# Patient Record
Sex: Male | Born: 1955 | Race: White | Hispanic: No | Marital: Married | Smoking: Never smoker
Health system: Southern US, Community
[De-identification: ages and names within clinical notes are randomized; demographics above are authoritative.]

## PROBLEM LIST (undated history)

## (undated) DIAGNOSIS — I10 Essential (primary) hypertension: Secondary | ICD-10-CM

---

## 1987-04-06 HISTORY — PX: APPENDECTOMY: SHX54

## 1987-04-06 HISTORY — PX: KNEE DEBRIDEMENT: SHX1894

## 2001-08-19 ENCOUNTER — Encounter: Payer: Self-pay | Admitting: Emergency Medicine

## 2001-08-19 ENCOUNTER — Emergency Department: Admission: EM | Admit: 2001-08-19 | Discharge: 2001-08-19 | Payer: Self-pay | Admitting: Emergency Medicine

## 2001-09-11 ENCOUNTER — Ambulatory Visit (HOSPITAL_COMMUNITY): Admission: RE | Admit: 2001-09-11 | Discharge: 2001-09-11 | Payer: Self-pay | Admitting: *Deleted

## 2001-09-11 ENCOUNTER — Encounter: Payer: Self-pay | Admitting: *Deleted

## 2002-06-22 ENCOUNTER — Inpatient Hospital Stay (HOSPITAL_COMMUNITY): Admission: EM | Admit: 2002-06-22 | Discharge: 2002-06-23 | Payer: Self-pay | Admitting: Emergency Medicine

## 2002-06-22 ENCOUNTER — Encounter: Payer: Self-pay | Admitting: Emergency Medicine

## 2002-06-23 ENCOUNTER — Encounter: Payer: Self-pay | Admitting: Internal Medicine

## 2009-10-08 ENCOUNTER — Emergency Department (HOSPITAL_COMMUNITY): Admission: EM | Admit: 2009-10-08 | Discharge: 2009-10-08 | Payer: Self-pay | Admitting: Emergency Medicine

## 2010-01-31 ENCOUNTER — Emergency Department (HOSPITAL_COMMUNITY): Admission: EM | Admit: 2010-01-31 | Discharge: 2010-01-31 | Payer: Self-pay | Admitting: Emergency Medicine

## 2010-06-21 LAB — POCT CARDIAC MARKERS
CKMB, poc: <1 ng/mL — ABNORMAL LOW (ref 1.0–8.0)
Myoglobin, poc: 91.3 ng/mL (ref 12–200)
Troponin i, poc: <0.05 ng/mL (ref 0.00–0.09)

## 2010-06-21 LAB — DIFFERENTIAL
Basophils Absolute: 0 10*3/uL (ref 0.0–0.1)
Eosinophils Absolute: 0.1 10*3/uL (ref 0.0–0.7)
Eosinophils Relative: 1 % (ref 0–5)
Lymphocytes Relative: 18 % (ref 12–46)
Monocytes Absolute: 0.7 10*3/uL (ref 0.1–1.0)

## 2010-06-21 LAB — CBC: MCH: 30.4 pg (ref 26.0–34.0)

## 2010-06-21 LAB — POCT I-STAT, CHEM 8
BUN: 16 mg/dL (ref 6–23)
Calcium, Ion: 1.07 mmol/L — ABNORMAL LOW (ref 1.12–1.32)
Chloride: 106 mEq/L (ref 96–112)
Glucose, Bld: 91 mg/dL (ref 70–99)
Hemoglobin: 15.6 g/dL (ref 13.0–17.0)

## 2011-04-07 ENCOUNTER — Other Ambulatory Visit: Payer: Self-pay | Admitting: Family Medicine

## 2011-08-16 ENCOUNTER — Other Ambulatory Visit: Payer: Self-pay | Admitting: Family Medicine

## 2011-08-16 DIAGNOSIS — M25561 Pain in right knee: Secondary | ICD-10-CM

## 2011-08-24 ENCOUNTER — Ambulatory Visit
Admission: RE | Admit: 2011-08-24 | Discharge: 2011-08-24 | Disposition: A | Discharge status: Home / Self Care | Payer: Managed Care, Other (non HMO) | Source: Ambulatory Visit | Attending: Family Medicine | Admitting: Family Medicine

## 2011-08-24 DIAGNOSIS — M25561 Pain in right knee: Secondary | ICD-10-CM

## 2011-09-04 ENCOUNTER — Ambulatory Visit
Admission: RE | Admit: 2011-09-04 | Discharge: 2011-09-04 | Disposition: A | Discharge status: Home / Self Care | Payer: Managed Care, Other (non HMO) | Source: Ambulatory Visit | Attending: Family Medicine | Admitting: Family Medicine

## 2015-07-30 DIAGNOSIS — S30860A Insect bite (nonvenomous) of lower back and pelvis, initial encounter: Secondary | ICD-10-CM | POA: Diagnosis not present

## 2016-01-19 DIAGNOSIS — F419 Anxiety disorder, unspecified: Secondary | ICD-10-CM | POA: Diagnosis not present

## 2016-01-19 DIAGNOSIS — Z0000 Encounter for general adult medical examination without abnormal findings: Secondary | ICD-10-CM | POA: Diagnosis not present

## 2016-01-19 DIAGNOSIS — Z125 Encounter for screening for malignant neoplasm of prostate: Secondary | ICD-10-CM | POA: Diagnosis not present

## 2016-01-19 DIAGNOSIS — Z23 Encounter for immunization: Secondary | ICD-10-CM | POA: Diagnosis not present

## 2016-01-19 DIAGNOSIS — M1711 Unilateral primary osteoarthritis, right knee: Secondary | ICD-10-CM | POA: Diagnosis not present

## 2016-01-19 DIAGNOSIS — I10 Essential (primary) hypertension: Secondary | ICD-10-CM | POA: Diagnosis not present

## 2016-01-19 DIAGNOSIS — G4700 Insomnia, unspecified: Secondary | ICD-10-CM | POA: Diagnosis not present

## 2016-03-03 DIAGNOSIS — K136 Irritative hyperplasia of oral mucosa: Secondary | ICD-10-CM | POA: Diagnosis not present

## 2016-08-04 DIAGNOSIS — M1711 Unilateral primary osteoarthritis, right knee: Secondary | ICD-10-CM | POA: Diagnosis not present

## 2016-08-04 DIAGNOSIS — M1712 Unilateral primary osteoarthritis, left knee: Secondary | ICD-10-CM | POA: Diagnosis not present

## 2016-10-19 DIAGNOSIS — I10 Essential (primary) hypertension: Secondary | ICD-10-CM | POA: Diagnosis not present

## 2016-10-19 DIAGNOSIS — G4700 Insomnia, unspecified: Secondary | ICD-10-CM | POA: Diagnosis not present

## 2016-10-19 DIAGNOSIS — R05 Cough: Secondary | ICD-10-CM | POA: Diagnosis not present

## 2016-10-19 DIAGNOSIS — H00015 Hordeolum externum left lower eyelid: Secondary | ICD-10-CM | POA: Diagnosis not present

## 2016-11-22 DIAGNOSIS — E669 Obesity, unspecified: Secondary | ICD-10-CM | POA: Diagnosis not present

## 2016-11-22 DIAGNOSIS — G4700 Insomnia, unspecified: Secondary | ICD-10-CM | POA: Diagnosis not present

## 2016-11-22 DIAGNOSIS — F419 Anxiety disorder, unspecified: Secondary | ICD-10-CM | POA: Diagnosis not present

## 2016-12-02 DIAGNOSIS — F419 Anxiety disorder, unspecified: Secondary | ICD-10-CM | POA: Diagnosis not present

## 2016-12-02 DIAGNOSIS — Z23 Encounter for immunization: Secondary | ICD-10-CM | POA: Diagnosis not present

## 2016-12-02 DIAGNOSIS — G4700 Insomnia, unspecified: Secondary | ICD-10-CM | POA: Diagnosis not present

## 2016-12-02 DIAGNOSIS — I10 Essential (primary) hypertension: Secondary | ICD-10-CM | POA: Diagnosis not present

## 2016-12-02 DIAGNOSIS — R0683 Snoring: Secondary | ICD-10-CM | POA: Diagnosis not present

## 2016-12-29 DIAGNOSIS — F5104 Psychophysiologic insomnia: Secondary | ICD-10-CM | POA: Diagnosis not present

## 2016-12-29 DIAGNOSIS — G478 Other sleep disorders: Secondary | ICD-10-CM | POA: Diagnosis not present

## 2016-12-29 DIAGNOSIS — R0683 Snoring: Secondary | ICD-10-CM | POA: Diagnosis not present

## 2016-12-29 DIAGNOSIS — F419 Anxiety disorder, unspecified: Secondary | ICD-10-CM | POA: Diagnosis not present

## 2017-01-05 DIAGNOSIS — F4322 Adjustment disorder with anxiety: Secondary | ICD-10-CM | POA: Diagnosis not present

## 2017-01-19 DIAGNOSIS — F4322 Adjustment disorder with anxiety: Secondary | ICD-10-CM | POA: Diagnosis not present

## 2017-04-18 DIAGNOSIS — I10 Essential (primary) hypertension: Secondary | ICD-10-CM | POA: Diagnosis not present

## 2017-04-18 DIAGNOSIS — F419 Anxiety disorder, unspecified: Secondary | ICD-10-CM | POA: Diagnosis not present

## 2017-04-18 DIAGNOSIS — M1711 Unilateral primary osteoarthritis, right knee: Secondary | ICD-10-CM | POA: Diagnosis not present

## 2017-04-18 DIAGNOSIS — Z125 Encounter for screening for malignant neoplasm of prostate: Secondary | ICD-10-CM | POA: Diagnosis not present

## 2017-04-18 DIAGNOSIS — Z0000 Encounter for general adult medical examination without abnormal findings: Secondary | ICD-10-CM | POA: Diagnosis not present

## 2017-04-18 DIAGNOSIS — G4700 Insomnia, unspecified: Secondary | ICD-10-CM | POA: Diagnosis not present

## 2017-06-03 DIAGNOSIS — M170 Bilateral primary osteoarthritis of knee: Secondary | ICD-10-CM | POA: Diagnosis not present

## 2017-06-03 DIAGNOSIS — M25562 Pain in left knee: Secondary | ICD-10-CM | POA: Diagnosis not present

## 2017-06-03 DIAGNOSIS — M25561 Pain in right knee: Secondary | ICD-10-CM | POA: Diagnosis not present

## 2017-06-29 DIAGNOSIS — M1711 Unilateral primary osteoarthritis, right knee: Secondary | ICD-10-CM | POA: Diagnosis not present

## 2017-06-29 DIAGNOSIS — M1712 Unilateral primary osteoarthritis, left knee: Secondary | ICD-10-CM | POA: Diagnosis not present

## 2017-07-27 DIAGNOSIS — M1712 Unilateral primary osteoarthritis, left knee: Secondary | ICD-10-CM | POA: Diagnosis not present

## 2017-07-27 DIAGNOSIS — M1711 Unilateral primary osteoarthritis, right knee: Secondary | ICD-10-CM | POA: Diagnosis not present

## 2018-04-20 DIAGNOSIS — M1711 Unilateral primary osteoarthritis, right knee: Secondary | ICD-10-CM | POA: Diagnosis not present

## 2018-04-20 DIAGNOSIS — G4700 Insomnia, unspecified: Secondary | ICD-10-CM | POA: Diagnosis not present

## 2018-04-20 DIAGNOSIS — I10 Essential (primary) hypertension: Secondary | ICD-10-CM | POA: Diagnosis not present

## 2018-04-20 DIAGNOSIS — F419 Anxiety disorder, unspecified: Secondary | ICD-10-CM | POA: Diagnosis not present

## 2018-10-09 DIAGNOSIS — M170 Bilateral primary osteoarthritis of knee: Secondary | ICD-10-CM | POA: Diagnosis not present

## 2018-11-13 DIAGNOSIS — M170 Bilateral primary osteoarthritis of knee: Secondary | ICD-10-CM | POA: Diagnosis not present

## 2018-11-15 DIAGNOSIS — Z86010 Personal history of colonic polyps: Secondary | ICD-10-CM | POA: Diagnosis not present

## 2018-11-15 DIAGNOSIS — K635 Polyp of colon: Secondary | ICD-10-CM | POA: Diagnosis not present

## 2018-11-15 DIAGNOSIS — K648 Other hemorrhoids: Secondary | ICD-10-CM | POA: Diagnosis not present

## 2018-11-21 DIAGNOSIS — M25552 Pain in left hip: Secondary | ICD-10-CM | POA: Diagnosis not present

## 2018-11-21 DIAGNOSIS — M7062 Trochanteric bursitis, left hip: Secondary | ICD-10-CM | POA: Diagnosis not present

## 2018-11-27 DIAGNOSIS — R7309 Other abnormal glucose: Secondary | ICD-10-CM | POA: Diagnosis not present

## 2018-11-27 DIAGNOSIS — Z23 Encounter for immunization: Secondary | ICD-10-CM | POA: Diagnosis not present

## 2018-11-27 DIAGNOSIS — K648 Other hemorrhoids: Secondary | ICD-10-CM | POA: Diagnosis not present

## 2018-11-27 DIAGNOSIS — Z125 Encounter for screening for malignant neoplasm of prostate: Secondary | ICD-10-CM | POA: Diagnosis not present

## 2018-11-27 DIAGNOSIS — I10 Essential (primary) hypertension: Secondary | ICD-10-CM | POA: Diagnosis not present

## 2018-11-27 DIAGNOSIS — L821 Other seborrheic keratosis: Secondary | ICD-10-CM | POA: Diagnosis not present

## 2018-11-27 DIAGNOSIS — Z0000 Encounter for general adult medical examination without abnormal findings: Secondary | ICD-10-CM | POA: Diagnosis not present

## 2019-01-05 DIAGNOSIS — D1801 Hemangioma of skin and subcutaneous tissue: Secondary | ICD-10-CM | POA: Diagnosis not present

## 2019-01-05 DIAGNOSIS — D235 Other benign neoplasm of skin of trunk: Secondary | ICD-10-CM | POA: Diagnosis not present

## 2019-01-05 DIAGNOSIS — D225 Melanocytic nevi of trunk: Secondary | ICD-10-CM | POA: Diagnosis not present

## 2019-01-05 DIAGNOSIS — L821 Other seborrheic keratosis: Secondary | ICD-10-CM | POA: Diagnosis not present

## 2019-01-05 DIAGNOSIS — D485 Neoplasm of uncertain behavior of skin: Secondary | ICD-10-CM | POA: Diagnosis not present

## 2019-01-05 DIAGNOSIS — L718 Other rosacea: Secondary | ICD-10-CM | POA: Diagnosis not present

## 2019-03-08 DIAGNOSIS — I10 Essential (primary) hypertension: Secondary | ICD-10-CM | POA: Diagnosis not present

## 2019-03-08 DIAGNOSIS — F419 Anxiety disorder, unspecified: Secondary | ICD-10-CM | POA: Diagnosis not present

## 2019-03-08 DIAGNOSIS — R7309 Other abnormal glucose: Secondary | ICD-10-CM | POA: Diagnosis not present

## 2019-03-08 DIAGNOSIS — G4700 Insomnia, unspecified: Secondary | ICD-10-CM | POA: Diagnosis not present

## 2019-04-25 DIAGNOSIS — M170 Bilateral primary osteoarthritis of knee: Secondary | ICD-10-CM | POA: Diagnosis not present

## 2019-07-07 ENCOUNTER — Ambulatory Visit: Payer: Managed Care, Other (non HMO) | Attending: Internal Medicine

## 2019-07-07 DIAGNOSIS — Z23 Encounter for immunization: Secondary | ICD-10-CM

## 2019-07-07 NOTE — Progress Notes (Signed)
   Covid-19 Vaccination Clinic  Name:  Walter Strickland    MRN: [id number] DOB: November 02, 1955  07/07/2019  [name] was observed post Covid-19 immunization for 30 minutes based on pre-vaccination screening without incident. He was provided with Vaccine Information Sheet and instruction to access the V-Safe system.   [name] was instructed to call 911 with any severe reactions post vaccine: Marland Kitchen Difficulty breathing  . Swelling of face and throat  . A fast heartbeat  . A bad rash all over body  . Dizziness and weakness   Immunizations Administered    Name Date Dose VIS Date Route   Pfizer COVID-19 Vaccine 07/07/2019  2:16 PM 0.3 mL 03/16/2019 Intramuscular   Manufacturer: [employer], [employer]   Lot: [id number]   NDC: [address]-1

## 2019-08-01 ENCOUNTER — Ambulatory Visit: Payer: Managed Care, Other (non HMO) | Attending: Internal Medicine

## 2019-08-01 DIAGNOSIS — Z23 Encounter for immunization: Secondary | ICD-10-CM

## 2019-08-01 NOTE — Progress Notes (Signed)
   Covid-19 Vaccination Clinic  Name:  Walter Strickland    MRN: [id number] DOB: 23-Mar-1956  08/01/2019  [name] was observed post Covid-19 immunization for 15 minutes without incident. He was provided with Vaccine Information Sheet and instruction to access the V-Safe system.   [name] was instructed to call 911 with any severe reactions post vaccine: Marland Kitchen Difficulty breathing  . Swelling of face and throat  . A fast heartbeat  . A bad rash all over body  . Dizziness and weakness   Immunizations Administered    Name Date Dose VIS Date Route   Pfizer COVID-19 Vaccine 08/01/2019  8:57 AM 0.3 mL 05/30/2018 Intramuscular   Manufacturer: [employer], [employer]   Lot: [id number]   NDC: [address]-8

## 2019-09-27 ENCOUNTER — Other Ambulatory Visit: Payer: Self-pay

## 2019-09-27 ENCOUNTER — Emergency Department (HOSPITAL_COMMUNITY)
Admission: EM | Admit: 2019-09-27 | Discharge: 2019-09-27 | Disposition: A | Discharge status: Home / Self Care | Payer: Medicare Other | Attending: Emergency Medicine | Admitting: Emergency Medicine

## 2019-09-27 ENCOUNTER — Emergency Department (HOSPITAL_COMMUNITY): Payer: Medicare Other

## 2019-09-27 DIAGNOSIS — Z79899 Other long term (current) drug therapy: Secondary | ICD-10-CM | POA: Diagnosis not present

## 2019-09-27 DIAGNOSIS — Z880 Allergy status to penicillin: Secondary | ICD-10-CM | POA: Diagnosis not present

## 2019-09-27 DIAGNOSIS — N281 Cyst of kidney, acquired: Secondary | ICD-10-CM | POA: Insufficient documentation

## 2019-09-27 DIAGNOSIS — R1031 Right lower quadrant pain: Secondary | ICD-10-CM | POA: Diagnosis present

## 2019-09-27 DIAGNOSIS — I10 Essential (primary) hypertension: Secondary | ICD-10-CM | POA: Diagnosis not present

## 2019-09-27 LAB — CBC WITH DIFFERENTIAL/PLATELET
Abs Immature Granulocytes: 0.04 10*3/uL (ref 0.00–0.07)
Basophils Absolute: 0 10*3/uL (ref 0.0–0.1)
Basophils Relative: 0 %
Eosinophils Absolute: 0 10*3/uL (ref 0.0–0.5)
Eosinophils Relative: 0 %
HCT: 45.7 % (ref 39.0–52.0)
Hemoglobin: 14.8 g/dL (ref 13.0–17.0)
Immature Granulocytes: 0 %
Lymphocytes Relative: 7 %
Lymphs Abs: 0.7 10*3/uL (ref 0.7–4.0)
MCH: 29.9 pg (ref 26.0–34.0)
MCHC: 32.4 g/dL (ref 30.0–36.0)
MCV: 92.3 fL (ref 80.0–100.0)
Monocytes Absolute: 0.4 10*3/uL (ref 0.1–1.0)
Monocytes Relative: 4 %
Neutro Abs: 9 10*3/uL — ABNORMAL HIGH (ref 1.7–7.7)
Neutrophils Relative %: 89 %
Platelets: 251 10*3/uL (ref 150–400)
RBC: 4.95 MIL/uL (ref 4.22–5.81)
RDW: 13.6 % (ref 11.5–15.5)
WBC: 10.2 10*3/uL (ref 4.0–10.5)
nRBC: 0 % (ref 0.0–0.2)

## 2019-09-27 LAB — COMPREHENSIVE METABOLIC PANEL
ALT: 21 U/L (ref 0–44)
AST: 17 U/L (ref 15–41)
Albumin: 4.2 g/dL (ref 3.5–5.0)
Alkaline Phosphatase: 69 U/L (ref 38–126)
Anion gap: 8 (ref 5–15)
BUN: 17 mg/dL (ref 8–23)
CO2: 24 mmol/L (ref 22–32)
Calcium: 8.9 mg/dL (ref 8.9–10.3)
Chloride: 107 mmol/L (ref 98–111)
Creatinine, Ser: 1.06 mg/dL (ref 0.61–1.24)
GFR calc Af Amer: >60 mL/min (ref >60)
GFR calc non Af Amer: >60 mL/min (ref >60)
Glucose, Bld: 141 mg/dL — ABNORMAL HIGH (ref 70–99)
Potassium: 4.3 mmol/L (ref 3.5–5.1)
Sodium: 139 mmol/L (ref 135–145)
Total Bilirubin: 0.6 mg/dL (ref 0.3–1.2)
Total Protein: 7.5 g/dL (ref 6.5–8.1)

## 2019-09-27 LAB — URINALYSIS, ROUTINE W REFLEX MICROSCOPIC
Bilirubin Urine: NEGATIVE
Glucose, UA: NEGATIVE mg/dL
Hgb urine dipstick: NEGATIVE
Ketones, ur: NEGATIVE mg/dL
Leukocytes,Ua: NEGATIVE
Nitrite: NEGATIVE
Protein, ur: NEGATIVE mg/dL
Specific Gravity, Urine: 1.016 (ref 1.005–1.030)
pH: 8 (ref 5.0–8.0)

## 2019-09-27 LAB — LIPASE, BLOOD: Lipase: 26 U/L (ref 11–51)

## 2019-09-27 MED ORDER — ONDANSETRON HCL 4 MG/2ML IJ SOLN
4.0000 mg | Freq: Once | INTRAMUSCULAR | Status: AC
  Administered 2019-09-27: 4 mg via INTRAVENOUS
  Filled 2019-09-27 (×2): qty 2

## 2019-09-27 MED ORDER — SODIUM CHLORIDE (PF) 0.9 % IJ SOLN
INTRAMUSCULAR | Status: AC
  Filled 2019-09-27: qty 50

## 2019-09-27 MED ORDER — MORPHINE SULFATE (PF) 4 MG/ML IV SOLN
4.0000 mg | Freq: Once | INTRAVENOUS | Status: AC
  Administered 2019-09-27: 4 mg via INTRAVENOUS
  Filled 2019-09-27 (×2): qty 1

## 2019-09-27 MED ORDER — IOHEXOL 300 MG/ML  SOLN
100.0000 mL | Freq: Once | INTRAMUSCULAR | Status: AC | PRN
  Administered 2019-09-27: 100 mL via INTRAVENOUS

## 2019-09-27 MED ORDER — METHOCARBAMOL 500 MG PO TABS: 500.0000 mg | ORAL_TABLET | Freq: Two times a day (BID) | ORAL | 0 refills | Status: DC

## 2019-09-27 NOTE — ED Provider Notes (Signed)
[facility]-EMERGENCY DEPT Provider Note   CSN: [id number] Arrival date & time: 09/27/19  1055     History Chief Complaint  Patient presents with  . Abdominal Pain    Walter Strickland is a 64 y.o. male who presents for evaluation of abdominal pain.  He states that over the last 4 weeks, he has had intermittent right lower quadrant abdominal pain.  He states that it occurs randomly.  It is not brought on by eating and is not worsened by eating.  He has been able to eat and drink without any difficulty.  He has had some occasional nausea but no vomiting.  He reports that this morning, the pain got acutely and severely worse. Since then, it has been a constant sharp pain. He states it is in the right lower quadrant does not radiate.  He states it is worse with attempting to move and walk.  His last bowel movement was yesterday morning and was normal.  No presence of blood.  He states he has had an appendectomy.  No other abdominal surgeries.  He denies any fevers, chest pain, difficulty breathing, dysuria, hematuria, diarrhea.  The history is provided by the patient.       No past medical history on file.  There are no problems to display for this patient.    The histories are not reviewed yet. Please review them in the "History" navigator section and refresh this SmartLink.     No family history on file.  Social History   Tobacco Use  . Smoking status: Not on file  Substance Use Topics  . Alcohol use: Not on file  . Drug use: Not on file    Home Medications Prior to Admission medications   Medication Sig Start Date End Date Taking? Authorizing Provider  amLODipine (NORVASC) 5 MG tablet Take 5 mg by mouth daily. 07/16/19  Yes [provider]  Cholecalciferol 25 MCG (1000 UT) capsule Take 1,000 Units by mouth daily.   Yes [provider]  clonazePAM (KLONOPIN) 1 MG tablet Take 1 mg by mouth 2 (two) times daily as needed for anxiety.  04/03/19  Yes [provider]  losartan (COZAAR) 100 MG tablet Take 100 mg by mouth daily. 07/16/19  Yes [provider]  methocarbamol (ROBAXIN) 500 MG tablet Take 1 tablet (500 mg total) by mouth 2 (two) times daily. 09/27/19   Maxwell Caul, PA-C    Allergies    Penicillins  Review of Systems   Review of Systems  Constitutional: Negative for fever.  Respiratory: Negative for cough and shortness of breath.   Cardiovascular: Negative for chest pain.  Gastrointestinal: Positive for abdominal pain and nausea. Negative for vomiting.  Genitourinary: Negative for dysuria and hematuria.  Neurological: Negative for headaches.  All other systems reviewed and are negative.   Physical Exam Updated Vital Signs BP (!) 155/83 (BP Location: Left Arm)   Pulse 67   Temp 98.2 F (36.8 C) (Oral)   Resp 19   SpO2 97%   Physical Exam Vitals and nursing note reviewed.  Constitutional:      Appearance: Normal appearance. He is well-developed.  HENT:     Head: Normocephalic and atraumatic.  Eyes:     General: Lids are normal.     Conjunctiva/sclera: Conjunctivae normal.     Pupils: Pupils are equal, round, and reactive to light.  Cardiovascular:     Rate and Rhythm: Normal rate and regular rhythm.  Pulses: Normal pulses.          Radial pulses are 2+ on the right side.       Dorsalis pedis pulses are 2+ on the right side and 2+ on the left side.     Heart sounds: Normal heart sounds. No murmur heard.  No friction rub. No gallop.   Pulmonary:     Effort: Pulmonary effort is normal.     Breath sounds: Normal breath sounds.     Comments: Lungs clear to auscultation bilaterally.  Symmetric chest rise.  No wheezing, rales, rhonchi. Abdominal:     Palpations: Abdomen is soft. Abdomen is not rigid.     Tenderness: There is abdominal tenderness in the right lower quadrant. There is no right CVA tenderness, left CVA tenderness or guarding.     Hernia: A hernia is present.  Hernia is present in the umbilical area. There is no hernia in the left inguinal area, right femoral area, left femoral area or right inguinal area.     Comments: Abdomen soft, nondistended.  Tenderness palpation of right lower quadrant.  No CVA tenderness.  No rigidity, guarding.  Easily reducible umbilical hernia.  No other hernia noted.  Genitourinary:    Comments: The exam was performed with a chaperone present. Normal male genitalia. No evidence of rash, ulcers or lesions.  Musculoskeletal:        General: Normal range of motion.     Cervical back: Full passive range of motion without pain.  Skin:    General: Skin is warm and dry.     Capillary Refill: Capillary refill takes less than 2 seconds.  Neurological:     Mental Status: He is alert and oriented to person, place, and time.  Psychiatric:        Speech: Speech normal.     ED Results / Procedures / Treatments   Labs (all labs ordered are listed, but only abnormal results are displayed) Labs Reviewed  COMPREHENSIVE METABOLIC PANEL - Abnormal; Notable for the following components:      Result Value   Glucose, Bld 141 (*)    All other components within normal limits  CBC WITH DIFFERENTIAL/PLATELET - Abnormal; Notable for the following components:   Neutro Abs 9.0 (*)    All other components within normal limits  LIPASE, BLOOD  URINALYSIS, ROUTINE W REFLEX MICROSCOPIC    EKG None  Radiology CT Abdomen Pelvis W Contrast  Result Date: 09/27/2019 CLINICAL DATA:  Abdominal pain for 4 weeks worsening EXAM: CT ABDOMEN AND PELVIS WITH CONTRAST TECHNIQUE: Multidetector CT imaging of the abdomen and pelvis was performed using the standard protocol following bolus administration of intravenous contrast. CONTRAST:  OMNIPAQUE IOHEXOL 300 MG/ML  SOLN COMPARISON:  None. FINDINGS: Lower chest: The visualized heart size within normal limits. No pericardial fluid/thickening. No hiatal hernia. The visualized portions of the lungs are  clear. Hepatobiliary: The liver is normal in density without focal abnormality.The main portal vein is patent. No evidence of calcified gallstones, gallbladder wall thickening or biliary dilatation. Pancreas: Unremarkable. No pancreatic ductal dilatation or surrounding inflammatory changes. Spleen: Normal in size without focal abnormality. Adrenals/Urinary Tract: Both adrenal glands appear normal. Multiple low-density lesions are both kidneys the largest 1 in lower of left kidney measures 4.2 cm consistent with a renal cyst. No or system calculi are seen. There is bilateral perinephric stranding right greater than left which is nonspecific. Bladder is partially decompressed. Stomach/Bowel: The stomach, small bowel, and colon are normal in appearance.  No inflammatory changes, wall thickening, or obstructive findings. Diverticula are noted without diverticulitis Vascular/Lymphatic: There are no enlarged mesenteric, retroperitoneal, or pelvic lymph nodes. Scattered aortic atherosclerotic calcifications are seen without aneurysmal dilatation. Reproductive: The prostate is unremarkable. Other: A small fat and bowel containing anterior umbilical hernia. No evidence of strangulation. Musculoskeletal: No acute or significant osseous findings. IMPRESSION: Mild bilateral perinephric stranding, right greater than left which is nonspecific, however could be due to infectious or inflammatory process. No renal or collecting system calculi. Diverticulosis without diverticulitis. Aortic Atherosclerosis (ICD10-I70.0). Electronically Signed   By: Prudencio Pair M.D.   On: 09/27/2019 13:03    Procedures Procedures (including critical care time)  Medications Ordered in ED Medications  ondansetron (ZOFRAN) injection 4 mg (has no administration in time range)  morphine 4 MG/ML injection 4 mg (has no administration in time range)  iohexol (OMNIPAQUE) 300 MG/ML solution 100 mL (100 mLs Intravenous Contrast Given 09/27/19 1210)    sodium chloride (PF) 0.9 % injection (  Given by Other 09/27/19 1315)    ED Course  I have reviewed the triage vital signs and the nursing notes.  Pertinent labs & imaging results that were available during my care of the patient were reviewed by me and considered in my medical decision making (see chart for details).    MDM Rules/Calculators/A&P                          64 year old male who presents for evaluation of abdominal pain.  He states it has been intermittently occurring for the last 4 weeks.  This morning, pain became more constant and severe.  No fevers, vomiting, diarrhea.  On initial ED arrival, he is afebrile, nontoxic-appearing.  Vital signs are stable.  On exam, he does have some tenderness noted on right lower quadrant.  Seems to be worse when he moves.  He does have an easily reducible umbilical hernia.  No other hernia noted.  Patient with previous appendectomy.  Low diverticulitis or infectious etiology.  Consider GU etiology versus musculoskeletal etiology given that it is worse with movement.  Plan check labs.  History/physical exam not concerning for dissection, ACS etiology.  Lipase is normal.  CBC shows no leukocytosis or anemia.  CMP shows normal BUN and creatinine.  UA negative for infectious etiology.  CT abdomen pelvis shows no evidence of acute abnormality.  He does have some stranding noted around the bilateral kidneys.  No evidence of kidney stone or or other acute abnormalities.  Discussed results with patient.  He refused pain medication.  He is comfortable when he sits.  When he gets up and walks and moves and bends, he feels that the pain is worse.  At this time, given reassuring work-up, reassuring CT scan, feel that this is most likely musculoskeletal in nature.  Wife is at his bedside now and states that he did mow the lawn right before he started having this pain a few weeks ago.  We will plan to treat with Robaxin. At this time, patient exhibits no  emergent life-threatening condition that require further evaluation in ED or admission. Discussed patient with Dr. Rex Kras who is agreeable to plan.  Patient had ample opportunity for questions and discussion. All patient's questions were answered with full understanding. Strict return precautions discussed. Patient expresses understanding and agreement to plan.   Portions of this note were generated with Lobbyist. Dictation errors may occur despite best attempts at proofreading.  Final Clinical Impression(s) / ED Diagnoses Final diagnoses:  Right lower quadrant abdominal pain  Renal cyst    Rx / DC Orders ED Discharge Orders         Ordered    methocarbamol (ROBAXIN) 500 MG tablet  2 times daily     Discontinue  Reprint     09/27/19 1429           Maxwell Caul, PA-C 09/27/19 1443    Little, Ambrose Finland, MD 09/28/19 1036

## 2019-09-27 NOTE — ED Notes (Signed)
Discharge paperwork and prescription reviewed with pt. Pt with no questions at this time. Family at bedside to drive pt home.

## 2019-09-27 NOTE — ED Notes (Signed)
Pt transported to CT ?

## 2019-09-27 NOTE — ED Notes (Signed)
Pt declined pain medication at this time, reports abdominal pain only present with movement. Will hold meds until pt requests them.

## 2019-09-27 NOTE — ED Triage Notes (Signed)
Pt BIBA from home.    Per EMS- Pt c/o abd pain x4 weeks, progressively worsening.  Today reports pain is unbearable.   100 mcg fentanyl (last given 1026).   PIV 20g r hand

## 2019-09-27 NOTE — Discharge Instructions (Signed)
As we discussed, your blood work and urine looked reassuring.  Your CT scan did show evidence of renal cysts on both kidneys.  These are most likely benign.  There is nothing to do about these today and is most likely not contributing to your pain.  These just need to be followed by her primary care doctor.  You can take Tylenol or Ibuprofen as directed for pain. You can alternate Tylenol and Ibuprofen every 4 hours. If you take Tylenol at 1pm, then you can take Ibuprofen at 5pm. Then you can take Tylenol again at 9pm.   Take Robaxin as prescribed. This medication will make you drowsy so do not drive or drink alcohol when taking it.  Follow-up with your primary care doctor in 2-4 days. Call their office and let them know you were seen in the ED.   Return to the Emergency Department immediately if you experience any worsening abdominal pain, fever, persistent nausea and vomiting, inability keep any food down, pain with urination, blood in your urine or any other worsening or concerning symptoms.

## 2020-07-16 ENCOUNTER — Other Ambulatory Visit: Payer: Self-pay

## 2020-07-16 ENCOUNTER — Encounter (INDEPENDENT_AMBULATORY_CARE_PROVIDER_SITE_OTHER): Payer: Self-pay | Admitting: Otolaryngology

## 2020-07-16 ENCOUNTER — Ambulatory Visit (INDEPENDENT_AMBULATORY_CARE_PROVIDER_SITE_OTHER): Payer: Medicare Other | Admitting: Otolaryngology

## 2020-07-16 VITALS — Temp 97.3°F

## 2020-07-16 DIAGNOSIS — J310 Chronic rhinitis: Secondary | ICD-10-CM | POA: Diagnosis not present

## 2020-07-16 DIAGNOSIS — J329 Chronic sinusitis, unspecified: Secondary | ICD-10-CM

## 2020-07-16 NOTE — Progress Notes (Signed)
HPI: Walter Strickland is a 65 y.o. male who presents is referred by his PCP for evaluation of complaints of chronic postnasal drip and has had for a couple of years.  He feels like this might of gotten worse since he started a new blood pressure medication amlodipine.  He does have history of allergies and has tried several antihistamines and he does not feel like it helped that much.  Although he did recently take Benadryl that seemed to help.  His drainage is worse at night and in the mornings.  It generally clears up during the day.  He has used Claritin in the past. He is allergic to penicillins..  No past medical history on file.  Social History   Socioeconomic History  . Marital status: Married    Spouse name: Not on file  . Number of children: Not on file  . Years of education: Not on file  . Highest education level: Not on file  Occupational History  . Not on file  Tobacco Use  . Smoking status: Never Smoker  . Smokeless tobacco: Never Used  Substance and Sexual Activity  . Alcohol use: Not on file  . Drug use: Not on file  . Sexual activity: Not on file  Other Topics Concern  . Not on file  Social History Narrative  . Not on file   Social Determinants of Health   Financial Resource Strain: Not on file  Food Insecurity: Not on file  Transportation Needs: Not on file  Physical Activity: Not on file  Stress: Not on file  Social Connections: Not on file   No family history on file. Allergies  Allergen Reactions  . Penicillins Rash   Prior to Admission medications   Medication Sig Start Date End Date Taking? Authorizing Provider  amLODipine (NORVASC) 5 MG tablet Take 5 mg by mouth daily. 07/16/19   [provider]  Cholecalciferol 25 MCG (1000 UT) capsule Take 1,000 Units by mouth daily.    [provider]  clonazePAM (KLONOPIN) 1 MG tablet Take 1 mg by mouth 2 (two) times daily as needed for anxiety. 04/03/19   [provider]  losartan  (COZAAR) 100 MG tablet Take 100 mg by mouth daily. 07/16/19   [provider]  methocarbamol (ROBAXIN) 500 MG tablet Take 1 tablet (500 mg total) by mouth 2 (two) times daily. 09/27/19   Maxwell Caul, PA-C     Positive ROS: Otherwise negative  All other systems have been reviewed and were otherwise negative with the exception of those mentioned in the HPI and as above.  Physical Exam: Constitutional: Alert, well-appearing, no acute distress Ears: External ears without lesions or tenderness. Ear canals are clear bilaterally with intact, clear TMs bilaterally. Nasal: External nose without lesions. Septum with minimal deformity and moderate rhinitis..  After decongesting the nose the middle meatus regions were clear bilaterally.  He had minimal clear mucus discharge in the nose and states that he is actually doing better today.  Posterior nasal cavity is clear. Oral: Lips and gums without lesions. Tongue and palate mucosa without lesions. Posterior oropharynx clear.  Symmetric appearing tonsils bilaterally.  Posterior oropharynx was clear with no significant postnasal drainage noted on clinical exam. Indirect laryngoscopy revealed a clear base of tongue vallecula and epiglottis.  Vocal cords were clear with normal mobility. Neck: No palpable adenopathy or masses Respiratory: Breathing comfortably  Skin: No facial/neck lesions or rash noted.  Procedures  Assessment: Chronic rhinitis with complaints of postnasal  drainage.  Plan: Reviewed with Walter Strickland concerning recommendations.  I would recommend regular use of nasal steroid spray such as Nasacort and prescribed Nasacort 2 sprays each nostril at night as this will help provide better airway passages and easier breathing as well as slightly less mucus production.  Also discussed with him concerning using saline irrigations as the salt water will help rinse the mucus out of the nose to help dry the nose up a little bit. Recommended  antihistamines plus minus as they do have some systemic effect but would also help dry things up if he is having bad allergy symptoms.  Apparently he recently saw his eye doctor who felt like he has some allergy symptoms in his eyes.  Concerning the antihistamines would recommend initially Claritin or Allegra as these are the least sedating antihistamines   Walter Bonds, MD   CC:

## 2021-03-05 ENCOUNTER — Encounter: Payer: Self-pay | Admitting: Allergy

## 2021-03-05 ENCOUNTER — Ambulatory Visit (INDEPENDENT_AMBULATORY_CARE_PROVIDER_SITE_OTHER): Payer: Medicare Other | Admitting: Allergy

## 2021-03-05 ENCOUNTER — Other Ambulatory Visit: Payer: Self-pay

## 2021-03-05 VITALS — BP 150/80 | HR 121 | Temp 98.1°F | Resp 18 | Ht 76.0 in | Wt 307.2 lb

## 2021-03-05 DIAGNOSIS — R0982 Postnasal drip: Secondary | ICD-10-CM

## 2021-03-05 DIAGNOSIS — J3089 Other allergic rhinitis: Secondary | ICD-10-CM | POA: Diagnosis not present

## 2021-03-05 MED ORDER — AZELASTINE HCL 0.1 % NA SOLN: 2.0000 | Freq: Two times a day (BID) | NASAL | 5 refills | Status: DC

## 2021-03-05 NOTE — Progress Notes (Signed)
New Patient Note  RE: Walter Strickland MRN: [id number] DOB: 1955/05/22 Date of Office Visit: 03/05/2021  Primary care provider: Antony Contras, MD  Chief Complaint: allergies  History of present illness: Walter Strickland is a 65 y.o. male presenting today for evaluation of post nasal drip.  He has been having issues with post nasal drip and is constantly sniffling and throat clearing.  Worse in the mornings and evenings.  He states this started couple years ago.  His PCP has prescribed antihistamines and nose sprays.  He has been to ENT.  He has tried fluticasone that was not helpful.  Also tried claritin and benadryl.   Had a dog that died several weeks ago.  Also cat in the home.   No history of food allergy, eczema or asthma.   Review of systems: Review of Systems  Constitutional: Negative.   HENT:  Positive for postnasal drip.   Eyes: Negative.   Respiratory: Negative.    Cardiovascular: Negative.   Musculoskeletal: Negative.   Skin: Negative.   Allergic/Immunologic: Negative.   Neurological: Negative.     All other systems negative unless noted above in HPI  Past medical history: History reviewed. No pertinent past medical history.  Past surgical history: Past Surgical History:  Procedure Laterality Date   [place]    Family history:  History reviewed. No pertinent family history.  Social history: Lives in a home with carpeting in the bedroom with electric heating and heat pump heating and central and heat pump cooling.  Dog and cat has been in the home.  No concern for water damage, mildew or roaches in the home.  He is a retired Cabin [employer].  He denies a smoking history.   Medication List: Current Outpatient Medications  Medication Sig Dispense Refill   Cholecalciferol 25 MCG (1000 UT) capsule Take 1,000 Units by mouth daily.     clonazePAM (KLONOPIN) 1 MG tablet Take 1 mg by mouth 2 (two) times daily as needed for  anxiety.     COVID-19 Test (ID NOW COVID-19) KIT ID NOW COVID-19 Test Kit  TEST AS DIRECTED TODAY     losartan (COZAAR) 100 MG tablet Take 100 mg by mouth daily.     Misc Natural Products (GLUCOSAMINE CHOND DOUBLE STR) TABS See admin instructions.     Omega-3 Fatty Acids (FISH OIL) 1000 MG CAPS Fish Oil 1,000 mg (120 mg-180 mg) capsule  Take by oral route.     Saw Palmetto 500 MG CAPS See admin instructions.     traZODone (DESYREL) 50 MG tablet 1 tablet at bedtime as needed     Turmeric 400 MG CAPS turmeric     No current facility-administered medications for this visit.    Known medication allergies: Allergies  Allergen Reactions   Penicillin V Rash   Penicillins Rash     Physical examination: Blood pressure (!) 150/80, pulse (!) 121, temperature 98.1 F (36.7 C), temperature source Temporal, resp. rate 18, height 6' 4"  (1.93 m), weight (!) 307 lb 3.2 oz (139.3 kg), SpO2 96 %.  General: Alert, interactive, in no acute distress. HEENT: PERRLA, TMs pearly gray, turbinates edematous with thick discharge, post-pharynx erythematous. Neck: Supple without lymphadenopathy. Lungs: Clear to auscultation without wheezing, rhonchi or rales. {no increased work of breathing. CV: Normal S1, S2 without murmurs. Abdomen: Nondistended, nontender. Skin: Warm and dry, without lesions or rashes. Extremities:  No clubbing, cyanosis or edema. Neuro:   Grossly intact.  Diagnositics/Labs:  Allergy testing:   Airborne Adult Perc - 03/05/21 1423     Time Antigen Placed 1423    Allergen Manufacturer Lavella Hammock    Location Back    Number of Test 59    1. Control-Buffer 50% Glycerol Negative    2. Control-Histamine 1 mg/ml Negative    3. Albumin saline Negative    4. [place] 2+    5. [place] 2+    6. [place] 2+    7. [place] Blue 3+    8. Meadow Fescue 3+    9. Perennial Rye 4+    10. Sweet Vernal 2+    11. Timothy Negative    12. Cocklebur Negative    13. Burweed Marshelder Negative    14.  Ragweed, short Negative    15. Ragweed, Giant Negative    16. Plantain,  English Negative    17. Lamb's Quarters Negative    18. Sheep Sorrell Negative    19. Rough Pigweed Negative    20. Marsh Elder, Rough Negative    21. Mugwort, Common Negative    22. Ash mix Negative    23. Birch mix Negative    24. Beech American Negative    25. Box, Elder Negative    26. Cedar, red Negative    27. Cottonwood, [place] Negative    28. Elm mix Negative    29. Hickory Negative    30. Maple mix Negative    31. Oak, [place] mix Negative    32. Pecan Pollen Negative    33. Pine mix Negative    34. Sycamore Eastern Negative    35. [place], Black Pollen Negative    36. Alternaria alternata Negative    37. Cladosporium Herbarum Negative    38. Aspergillus mix Negative    39. Penicillium mix Negative    40. Bipolaris sorokiniana (Helminthosporium) Negative    41. Drechslera spicifera (Curvularia) Negative    42. Mucor plumbeus Negative    43. Fusarium moniliforme Negative    44. Aureobasidium pullulans (pullulara) Negative    45. Rhizopus oryzae Negative    46. Botrytis cinera Negative    47. Epicoccum nigrum Negative    48. Phoma betae Negative    49. Candida Albicans Negative    50. Trichophyton mentagrophytes Negative    51. Mite, D Farinae  5,000 AU/ml Negative    52. Mite, D Pteronyssinus  5,000 AU/ml Negative    53. Cat Hair 10,000 BAU/ml Negative    54.  Dog Epithelia Negative    55. Mixed Feathers Negative    56. Horse Epithelia Negative    57. Cockroach, German Negative    58. Mouse Negative    59. Tobacco Leaf Negative             Intradermal - 03/05/21 1506     Time Antigen Placed 1506    Allergen Manufacturer Lavella Hammock    Location Arm    Number of Test 12    Control Negative    Ragweed mix 2+    Weed mix Negative    Tree mix Negative    Mold 1 Negative    Mold 2 2+    Mold 3 2+    Mold 4 2+    Cat 2+    Dog 2+    Cockroach 2+    Mite mix 2+              Allergy testing results were read and interpreted by provider, documented by clinical  staff.   Assessment and plan: Allergic rhinitis with significant post-nasal drip component   - Testing today showed: grasses, ragweed, indoor molds, outdoor molds, dust mites, cat, and dog. - Copy of test results provided.  - Avoidance measures provided. - Start taking: Astelin (azelastine) 2 sprays per nostril 2 times daily  - Consider allergy shots as a means of long-term control. - Allergy shots "re-train" and "reset" the immune system to ignore environmental allergens and decrease the resulting immune response to those allergens (sneezing, itchy watery eyes, runny nose, nasal congestion, etc).    - Allergy shots improve symptoms in 75-85% of patients.  - We can discuss more at the next appointment if the medications are not working for you.   Follow-up in 4-6 months or sooner if needed  I appreciate the opportunity to take part in Choua's care. Please do not hesitate to contact me with questions.  Sincerely,   Prudy Feeler, MD Allergy/Immunology [facility]

## 2021-03-05 NOTE — Patient Instructions (Addendum)
-   Testing today showed: grasses, ragweed, indoor molds, outdoor molds, dust mites, cat, and dog. - Copy of test results provided.  - Avoidance measures provided. - Start taking: Astelin (azelastine) 2 sprays per nostril 2 times daily  - Consider allergy shots as a means of long-term control. - Allergy shots "re-train" and "reset" the immune system to ignore environmental allergens and decrease the resulting immune response to those allergens (sneezing, itchy watery eyes, runny nose, nasal congestion, etc).    - Allergy shots improve symptoms in 75-85% of patients.  - We can discuss more at the next appointment if the medications are not working for you.   Follow-up in 4-6 months or sooner if needed

## 2021-04-29 ENCOUNTER — Encounter (HOSPITAL_BASED_OUTPATIENT_CLINIC_OR_DEPARTMENT_OTHER): Payer: Self-pay | Admitting: Obstetrics and Gynecology

## 2021-04-29 ENCOUNTER — Other Ambulatory Visit: Payer: Self-pay

## 2021-04-29 ENCOUNTER — Emergency Department (HOSPITAL_BASED_OUTPATIENT_CLINIC_OR_DEPARTMENT_OTHER)
Admission: EM | Admit: 2021-04-29 | Discharge: 2021-04-29 | Disposition: A | Discharge status: Home / Self Care | Payer: Medicare Other | Attending: Emergency Medicine | Admitting: Emergency Medicine

## 2021-04-29 DIAGNOSIS — I10 Essential (primary) hypertension: Secondary | ICD-10-CM | POA: Diagnosis not present

## 2021-04-29 DIAGNOSIS — Z79899 Other long term (current) drug therapy: Secondary | ICD-10-CM | POA: Insufficient documentation

## 2021-04-29 DIAGNOSIS — R42 Dizziness and giddiness: Secondary | ICD-10-CM | POA: Diagnosis present

## 2021-04-29 HISTORY — DX: Essential (primary) hypertension: I10

## 2021-04-29 LAB — BASIC METABOLIC PANEL
Anion gap: 13 (ref 5–15)
BUN: 12 mg/dL (ref 8–23)
CO2: 21 mmol/L — ABNORMAL LOW (ref 22–32)
Calcium: 9.8 mg/dL (ref 8.9–10.3)
Chloride: 105 mmol/L (ref 98–111)
Creatinine, Ser: 1.19 mg/dL (ref 0.61–1.24)
GFR, Estimated: >60 mL/min (ref >60)
Glucose, Bld: 83 mg/dL (ref 70–99)
Potassium: 3.8 mmol/L (ref 3.5–5.1)
Sodium: 139 mmol/L (ref 135–145)

## 2021-04-29 LAB — CBC
HCT: 48.7 % (ref 39.0–52.0)
Hemoglobin: 15.5 g/dL (ref 13.0–17.0)
MCH: 28.9 pg (ref 26.0–34.0)
MCHC: 31.8 g/dL (ref 30.0–36.0)
MCV: 90.7 fL (ref 80.0–100.0)
Platelets: 242 10*3/uL (ref 150–400)
RBC: 5.37 MIL/uL (ref 4.22–5.81)
RDW: 13.6 % (ref 11.5–15.5)
WBC: 8.4 10*3/uL (ref 4.0–10.5)
nRBC: 0 % (ref 0.0–0.2)

## 2021-04-29 LAB — TROPONIN I (HIGH SENSITIVITY): Troponin I (High Sensitivity): 5 ng/L (ref <18)

## 2021-04-29 NOTE — ED Triage Notes (Signed)
Patient presents to the ER for Hypertension. Reports it has been about 30 points higher than. Denies headaches but reports lightheaded.

## 2021-04-29 NOTE — ED Provider Notes (Signed)
[facility] EMERGENCY DEPT Provider Note   CSN: [id number] Arrival date & time: 04/29/21  0488     History  Chief Complaint  Patient presents with   Hypertension    Walter Strickland is a 66 y.o. male.   Hypertension Pertinent negatives include no chest pain. Patient presents with hypertension and dizziness.  States blood pressures been running higher.  Normally runs about 891 systolic.  States has been up to 180 190.  No headache.  States he feels a little dizzy.  Does not feel as if the room spinning.  States he feels a little unsteady when he stands.  Patient also has been fasting.  States he is trying to lose 25 pounds this month.  States that he is down 5 pounds over the last week by not eating as much.  No chest pain.  No trouble breathing.  No swelling in his legs.  States he does have a mild ringing in his ears.  No vision changes.  Patient has a history of hypertension and is on losartan.  Had previous been on amlodipine but states he took himself off it.  States he had seen his primary care doctor previously.  Has an appointment scheduled for follow-up with [place] in another week and a half.  States there was talk about starting him back on his medicine.     Home Medications Prior to Admission medications   Medication Sig Start Date End Date Taking? Authorizing Provider  azelastine (ASTELIN) 0.1 % nasal spray Place 2 sprays into both nostrils 2 (two) times daily. Use in each nostril as directed 03/05/21   Kennith Gain, MD  Cholecalciferol 25 MCG (1000 UT) capsule Take 1,000 Units by mouth daily.    [provider]  clonazePAM (KLONOPIN) 1 MG tablet Take 1 mg by mouth 2 (two) times daily as needed for anxiety. 04/03/19   [provider]  COVID-19 Test (ID NOW COVID-19) KIT ID NOW COVID-19 Test Kit  TEST AS DIRECTED TODAY    [provider]  losartan (COZAAR) 100 MG tablet Take 100 mg by mouth daily. 07/16/19   [provider]  Misc Natural Products (GLUCOSAMINE CHOND DOUBLE STR) TABS See admin instructions.    [provider]  Omega-3 Fatty Acids (FISH OIL) 1000 MG CAPS Fish Oil 1,000 mg (120 mg-180 mg) capsule  Take by oral route.    [provider]  Saw Palmetto 500 MG CAPS See admin instructions.    [provider]  traZODone (DESYREL) 50 MG tablet 1 tablet at bedtime as needed    [provider]  Turmeric 400 MG CAPS turmeric    [provider]      Allergies    Penicillin v and Penicillins    Review of Systems   Review of Systems  Constitutional:  Negative for appetite change.  HENT:  Negative for voice change.   Cardiovascular:  Negative for chest pain.  Genitourinary:  Negative for enuresis.  Musculoskeletal:  Negative for back pain.  Neurological:  Positive for dizziness.  Psychiatric/Behavioral:  Negative for confusion.    Physical Exam Updated Vital Signs BP (!) 174/91    Pulse 65    Temp 98.1 F (36.7 C)    Resp 16    Ht 6' 4"  (1.93 m)    Wt 133.8 kg    SpO2 98%    BMI 35.91 kg/m  Physical Exam Vitals and nursing note reviewed.  HENT:     Head:  Atraumatic.     Right Ear: Tympanic membrane normal.     Left Ear: Tympanic membrane normal.     Mouth/Throat:     Mouth: Mucous membranes are moist.  Eyes:     Extraocular Movements: Extraocular movements intact.  Cardiovascular:     Rate and Rhythm: Regular rhythm.  Abdominal:     Tenderness: There is no abdominal tenderness.  Musculoskeletal:        General: No tenderness.  Skin:    General: Skin is warm.     Capillary Refill: Capillary refill takes less than 2 seconds.  Neurological:     Mental Status: He is alert and oriented to person, place, and time.     Comments: Finger-nose intact.  Normal ambulation.  Eye movements intact    ED Results / Procedures / Treatments   Labs (all labs ordered are listed, but only abnormal results are displayed) Labs Reviewed  BASIC  METABOLIC PANEL - Abnormal; Notable for the following components:      Result Value   CO2 21 (*)    All other components within normal limits  CBC  TROPONIN I (HIGH SENSITIVITY)  TROPONIN I (HIGH SENSITIVITY)    EKG EKG Interpretation  Date/Time:  Wednesday April 29 2021 18:49:37 EST Ventricular Rate:  78 PR Interval:  168 QRS Duration: 84 QT Interval:  376 QTC Calculation: 428 R Axis:   75 Text Interpretation: Normal sinus rhythm Normal ECG When compared with ECG of 08-Oct-2009 19:25, No significant change was found No significant change since last tracing Confirmed by Davonna Belling [phone]) on 04/29/2021 7:37:08 PM  Radiology No results found.  Procedures Procedures    Medications Ordered in ED Medications - No data to display  ED Course/ Medical Decision Making/ A&P                           Medical Decision Making Problems Addressed: Dizziness: acute illness or injury Hypertension, unspecified type: acute illness or injury  Amount and/or Complexity of Data Reviewed Labs: ordered. ECG/medicine tests: independent interpretation performed. Decision-making details documented in ED Course.   Patient presents with hypertension.  Blood pressure around 180/90.  No chest pain.  No trouble breathing.  Is not appear to have endorgan damage.  Does have little bit of dizziness but sounds almost more orthostatic.  Worse if he stands up.  States he has been fasting and attempt to lose weight.  Is been down 5 pounds over the last week.  Not orthostatic on exam.  Nonfocal exam otherwise.  Doubt acute stroke as the cause.  Doubt severe hypertension as a cause of dizziness.  Do not feels about need head CT at this time.  No renal issues.  EKG reassuring with no significant change.  Patient will call his PCP tomorrow about starting back on his other blood pressure medicine.  Unsure of the dose he was on.  Appears stable for discharge.        Final Clinical Impression(s) / ED  Diagnoses Final diagnoses:  Hypertension, unspecified type  Dizziness    Rx / DC Orders ED Discharge Orders     None         Davonna Belling, MD 04/29/21 2037

## 2021-04-29 NOTE — Discharge Instructions (Signed)
Call your doctor tomorrow about potentially starting back on your other blood pressure medicine.

## 2022-01-12 IMAGING — CT CT ABD-PELV W/ CM
2 of 5 series · 16 of 46 positions shown, 18 images · IV contrast (OMNIPAQUE 300)
Comparison: None.

CLINICAL DATA: Abdominal pain for 4 weeks worsening

EXAM:
CT ABDOMEN AND PELVIS WITH CONTRAST
TECHNIQUE: Multidetector CT imaging of the abdomen and pelvis was performed
using the standard protocol following bolus administration of
intravenous contrast.
CONTRAST:  100mL OMNIPAQUE IOHEXOL 300 MG/ML  SOLN

[Series 2: axial st · axial · 0.92mm/px · z∈[-581,-161]mm · 13 of 100 slices shown, 15 images]
[im 8/100  soft-tissue]
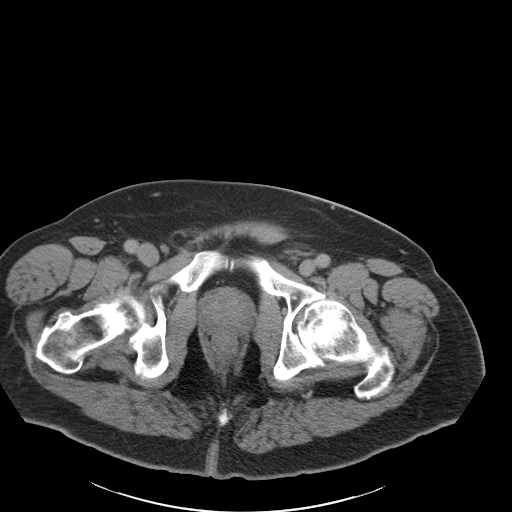
[im 8/100  bone]
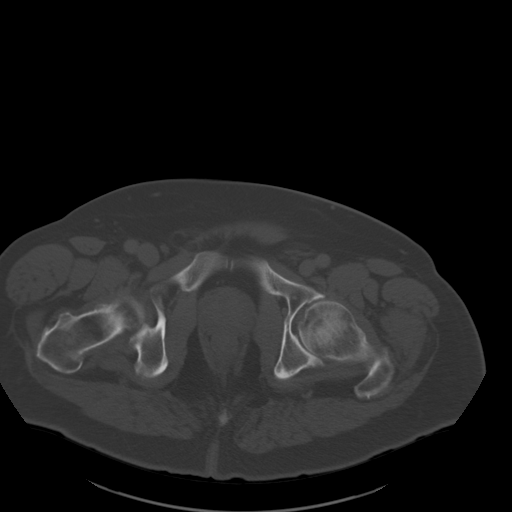
[im 15/100  soft-tissue]
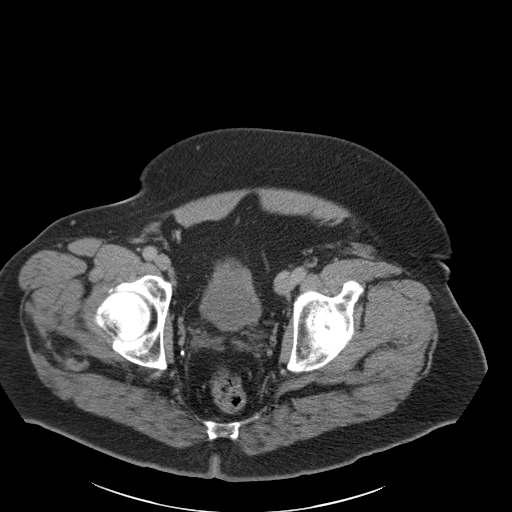
[im 22/100  soft-tissue]
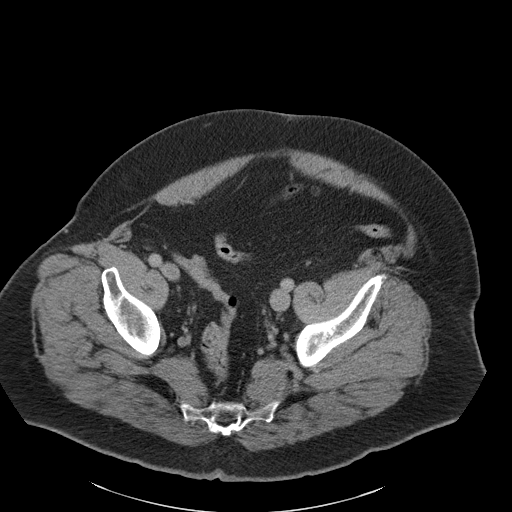
[im 29/100  soft-tissue]
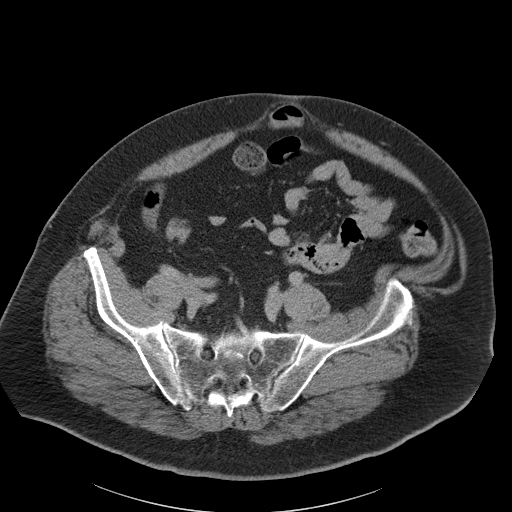
[im 36/100  soft-tissue]
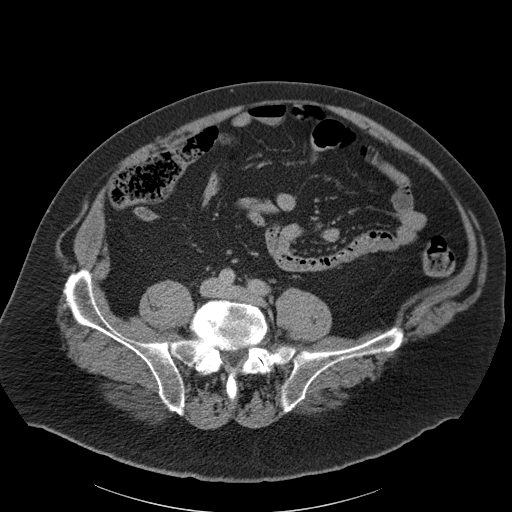
[im 43/100  soft-tissue]
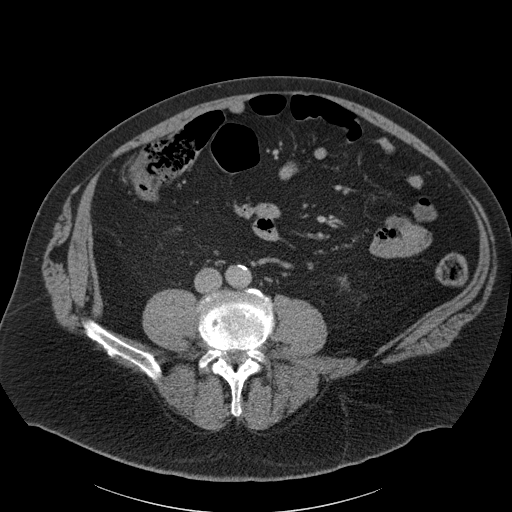
[im 50/100  soft-tissue]
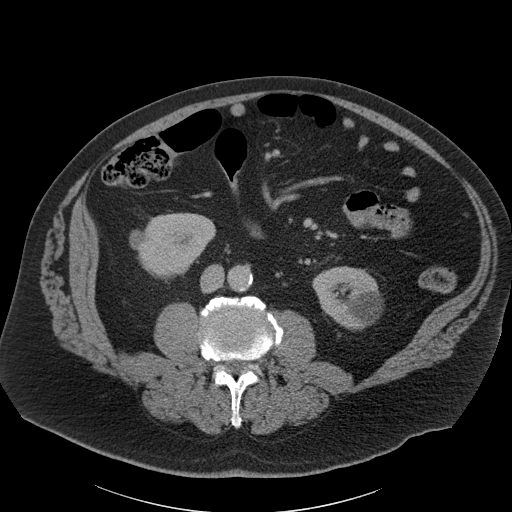
[im 57/100  soft-tissue]
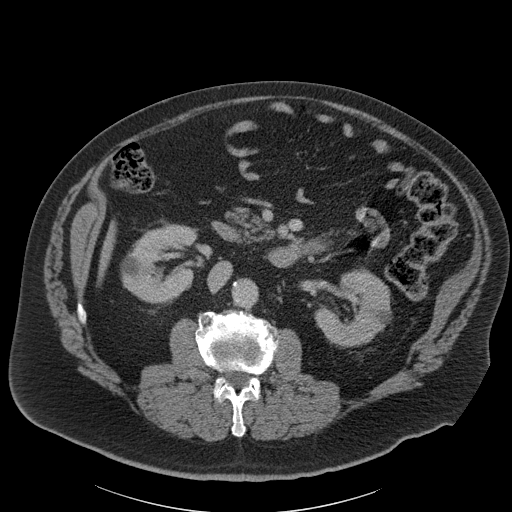
[im 64/100  soft-tissue]
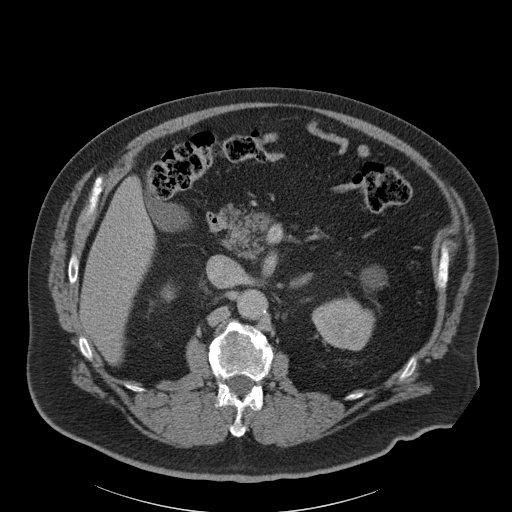
[im 64/100  bone]
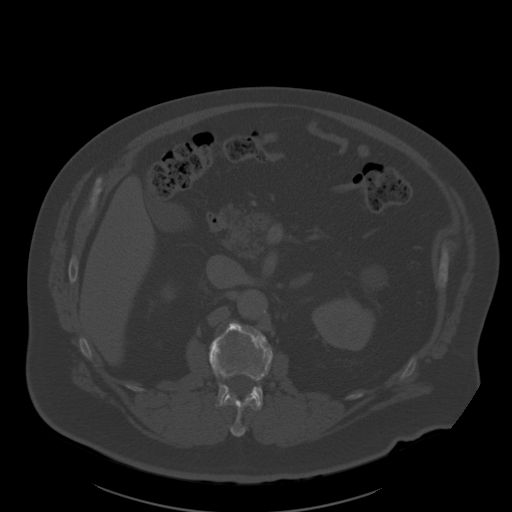
[im 71/100  soft-tissue]
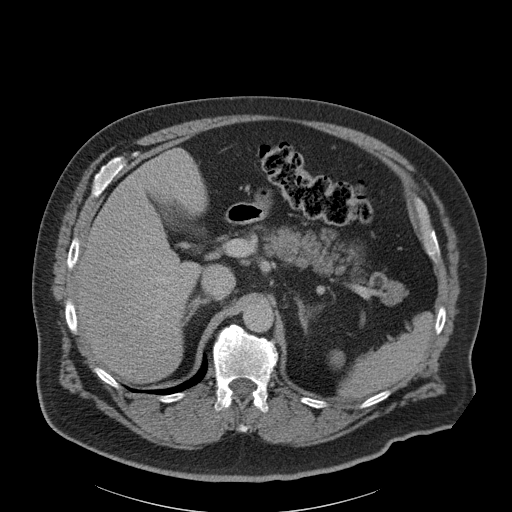
[im 78/100  soft-tissue]
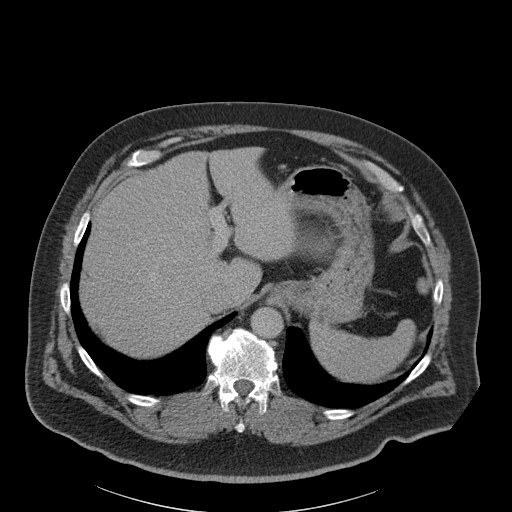
[im 85/100  soft-tissue]
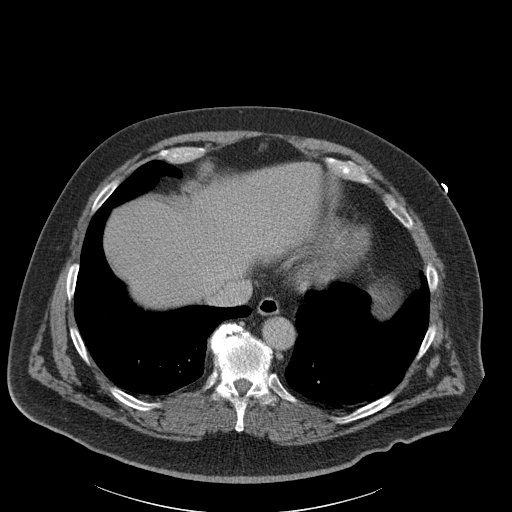
[im 92/100  soft-tissue]
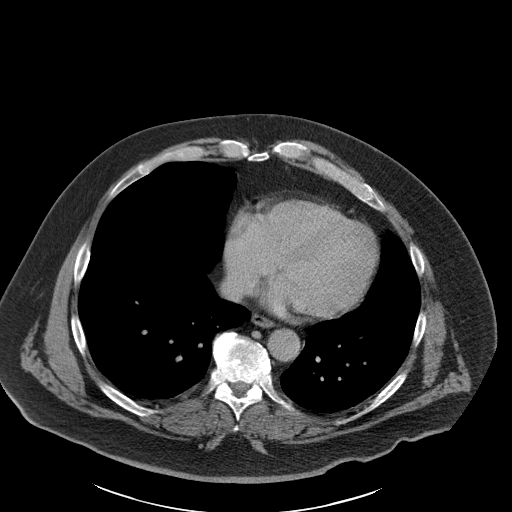

[Series 4: coronal st · coronal · 1.07mm/px · 3 of 172 slices shown]
[im 58/172  soft-tissue]
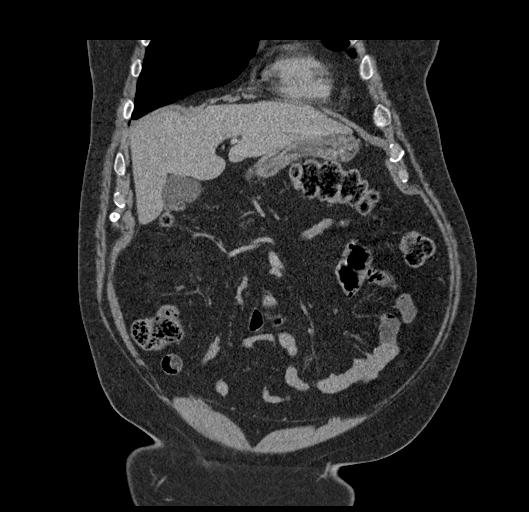
[im 77/172  soft-tissue]
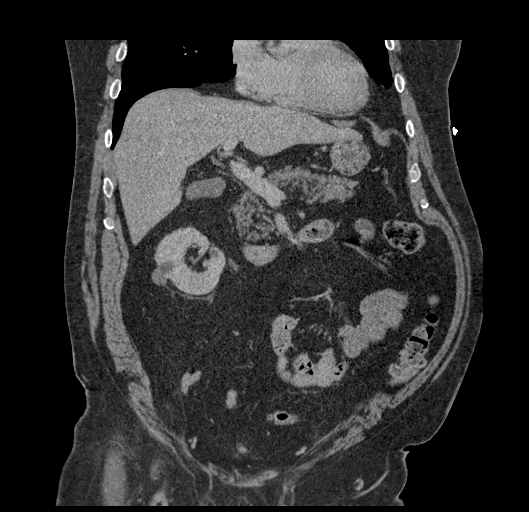
[im 96/172  soft-tissue]
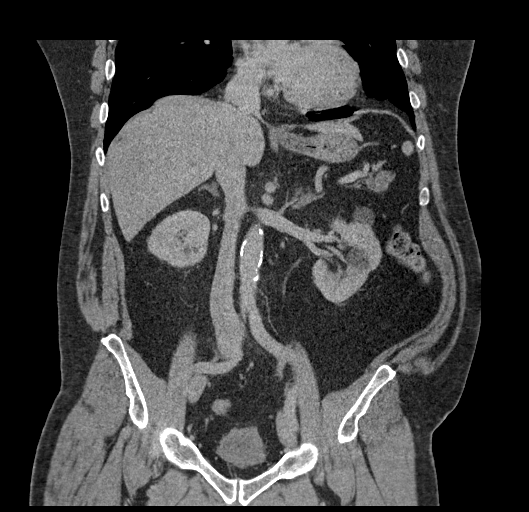

[16 of 46 positions shown; findings below may reference images not displayed]

FINDINGS: Lower chest: The visualized heart size within normal limits. No
pericardial fluid/thickening.

No hiatal hernia.

The visualized portions of the lungs are clear.

Hepatobiliary: The liver is normal in density without focal
abnormality.The main portal vein is patent. No evidence of calcified
gallstones, gallbladder wall thickening or biliary dilatation.

Pancreas: Unremarkable. No pancreatic ductal dilatation or
surrounding inflammatory changes.

Spleen: Normal in size without focal abnormality.

Adrenals/Urinary Tract: Both adrenal glands appear normal. Multiple
low-density lesions are both kidneys the largest 1 in lower of left
kidney measures 4.2 cm consistent with a renal cyst. No or system
calculi are seen. There is bilateral perinephric stranding right
greater than left which is nonspecific. Bladder is partially
decompressed.

Stomach/Bowel: The stomach, small bowel, and colon are normal in
appearance. No inflammatory changes, wall thickening, or obstructive
findings. Diverticula are noted without diverticulitis

Vascular/Lymphatic: There are no enlarged mesenteric,
retroperitoneal, or pelvic lymph nodes. Scattered aortic
atherosclerotic calcifications are seen without aneurysmal
dilatation.

Reproductive: The prostate is unremarkable.

Other: A small fat and bowel containing anterior umbilical hernia.
No evidence of strangulation.

Musculoskeletal: No acute or significant osseous findings.
IMPRESSION: Mild bilateral perinephric stranding, right greater than left which
is nonspecific, however could be due to infectious or inflammatory
process. No renal or collecting system calculi.

Diverticulosis without diverticulitis.

Aortic Atherosclerosis ([id number]).

## 2022-04-19 ENCOUNTER — Emergency Department (HOSPITAL_BASED_OUTPATIENT_CLINIC_OR_DEPARTMENT_OTHER): Payer: Medicare Other | Admitting: Radiology

## 2022-04-19 ENCOUNTER — Other Ambulatory Visit: Payer: Self-pay

## 2022-04-19 ENCOUNTER — Encounter (HOSPITAL_BASED_OUTPATIENT_CLINIC_OR_DEPARTMENT_OTHER): Payer: Self-pay | Admitting: Emergency Medicine

## 2022-04-19 ENCOUNTER — Emergency Department (HOSPITAL_BASED_OUTPATIENT_CLINIC_OR_DEPARTMENT_OTHER)
Admission: EM | Admit: 2022-04-19 | Discharge: 2022-04-19 | Disposition: A | Discharge status: Home / Self Care | Payer: Medicare Other | Attending: Emergency Medicine | Admitting: Emergency Medicine

## 2022-04-19 DIAGNOSIS — I10 Essential (primary) hypertension: Secondary | ICD-10-CM | POA: Insufficient documentation

## 2022-04-19 DIAGNOSIS — M25512 Pain in left shoulder: Secondary | ICD-10-CM | POA: Insufficient documentation

## 2022-04-19 DIAGNOSIS — Z79899 Other long term (current) drug therapy: Secondary | ICD-10-CM | POA: Insufficient documentation

## 2022-04-19 LAB — BASIC METABOLIC PANEL
Anion gap: 9 (ref 5–15)
BUN: 25 mg/dL — ABNORMAL HIGH (ref 8–23)
CO2: 21 mmol/L — ABNORMAL LOW (ref 22–32)
Calcium: 9.5 mg/dL (ref 8.9–10.3)
Chloride: 107 mmol/L (ref 98–111)
Creatinine, Ser: 1.3 mg/dL — ABNORMAL HIGH (ref 0.61–1.24)
GFR, Estimated: 60 mL/min — ABNORMAL LOW (ref >60)
Glucose, Bld: 104 mg/dL — ABNORMAL HIGH (ref 70–99)
Potassium: 4.3 mmol/L (ref 3.5–5.1)
Sodium: 137 mmol/L (ref 135–145)

## 2022-04-19 LAB — CBC
HCT: 43.5 % (ref 39.0–52.0)
Hemoglobin: 14.5 g/dL (ref 13.0–17.0)
MCH: 29.5 pg (ref 26.0–34.0)
MCHC: 33.3 g/dL (ref 30.0–36.0)
MCV: 88.6 fL (ref 80.0–100.0)
Platelets: 248 10*3/uL (ref 150–400)
RBC: 4.91 MIL/uL (ref 4.22–5.81)
RDW: 13.9 % (ref 11.5–15.5)
WBC: 6.3 10*3/uL (ref 4.0–10.5)
nRBC: 0 % (ref 0.0–0.2)

## 2022-04-19 LAB — TROPONIN I (HIGH SENSITIVITY): Troponin I (High Sensitivity): 5 ng/L (ref <18)

## 2022-04-19 MED ORDER — DICLOFENAC SODIUM 1 % EX GEL: 4.0000 g | Freq: Four times a day (QID) | CUTANEOUS | 0 refills | Status: DC

## 2022-04-19 NOTE — ED Provider Notes (Signed)
[facility] EMERGENCY DEPT Provider Note   CSN: [id number] Arrival date & time: 04/19/22  1156     History  Chief Complaint  Patient presents with   Chest Pain    Walter Strickland is a 67 y.o. male.  67 yo M with a cc of with a cc of chest pain.  To the left anterior shoulder and goes down arm.  This been going on for a couple days now.  Atraumatic.  He was like the pain goes from his neck down the arm.  Seems to come and go.  Worse in the morning.  Had some difficulty breathing when walking the dog this morning and so came here for evaluation.  Patient denies history of MI, denies  hyperlipidemia diabetes or smoking.    He has a history of hypertension.  His father had an MI in his 27s.  Patient denies history of PE or DVT denies hemoptysis denies unilateral lower extremity edema denies recent surgery immobilization hospitalization estrogen use or history of cancer.     Chest Pain      Home Medications Prior to Admission medications   Medication Sig Start Date End Date Taking? Authorizing Provider  diclofenac Sodium (VOLTAREN) 1 % GEL Apply 4 g topically 4 (four) times daily. 04/19/22  Yes Deno Etienne, DO  azelastine (ASTELIN) 0.1 % nasal spray Place 2 sprays into both nostrils 2 (two) times daily. Use in each nostril as directed 03/05/21   Kennith Gain, MD  Cholecalciferol 25 MCG (1000 UT) capsule Take 1,000 Units by mouth daily.    [provider]  clonazePAM (KLONOPIN) 1 MG tablet Take 1 mg by mouth 2 (two) times daily as needed for anxiety. 04/03/19   [provider]  COVID-19 Test (ID NOW COVID-19) KIT ID NOW COVID-19 Test Kit  TEST AS DIRECTED TODAY    [provider]  losartan (COZAAR) 100 MG tablet Take 100 mg by mouth daily. 07/16/19   [provider]  Misc Natural Products (GLUCOSAMINE CHOND DOUBLE STR) TABS See admin instructions.    [provider]  Omega-3 Fatty Acids (FISH OIL) 1000 MG CAPS  Fish Oil 1,000 mg (120 mg-180 mg) capsule  Take by oral route.    [provider]  Saw Palmetto 500 MG CAPS See admin instructions.    [provider]  traZODone (DESYREL) 50 MG tablet 1 tablet at bedtime as needed    [provider]  Turmeric 400 MG CAPS turmeric    [provider]      Allergies    Penicillin v and Penicillins    Review of Systems   Review of Systems  Cardiovascular:  Positive for chest pain.    Physical Exam Updated Vital Signs BP 133/68   Pulse 66   Temp 98 F (36.7 C) (Oral)   Resp 13   Ht 6\' 3"  (1.905 m)   Wt 121.6 kg   SpO2 95%   BMI 33.50 kg/m  Physical Exam Vitals and nursing note reviewed.  Constitutional:      Appearance: He is well-developed.  HENT:     Head: Normocephalic and atraumatic.  Eyes:     Pupils: Pupils are equal, round, and reactive to light.  Neck:     Vascular: No JVD.  Cardiovascular:     Rate and Rhythm: Normal rate and regular rhythm.     Heart sounds: No murmur heard.    No friction rub. No gallop.  Pulmonary:  Effort: No respiratory distress.     Breath sounds: No wheezing.  Abdominal:     General: There is no distension.     Tenderness: There is no abdominal tenderness. There is no guarding or rebound.  Musculoskeletal:        General: Normal range of motion.     Cervical back: Normal range of motion and neck supple.     Comments: Winces with forward flexion.  Some weakness with open can.  Pulse motor and sensation intact distally.  Skin:    Coloration: Skin is not pale.     Findings: No rash.  Neurological:     Mental Status: He is alert and oriented to person, place, and time.  Psychiatric:        Behavior: Behavior normal.     ED Results / Procedures / Treatments   Labs (all labs ordered are listed, but only abnormal results are displayed) Labs Reviewed  BASIC METABOLIC PANEL - Abnormal; Notable for the following components:      Result Value   CO2 21 (*)     Glucose, Bld 104 (*)    BUN 25 (*)    Creatinine, Ser 1.30 (*)    GFR, Estimated 60 (*)    All other components within normal limits  CBC  TROPONIN I (HIGH SENSITIVITY)  TROPONIN I (HIGH SENSITIVITY)    EKG EKG Interpretation  Date/Time:  Monday April 19 2022 12:04:35 EST Ventricular Rate:  76 PR Interval:  184 QRS Duration: 93 QT Interval:  370 QTC Calculation: 416 R Axis:   61 Text Interpretation: Sinus rhythm Consider left atrial enlargement Baseline wander Otherwise no significant change Confirmed by Deno Etienne [id number]) on 04/19/2022 12:06:07 PM  Radiology DG Chest 2 View  Result Date: 04/19/2022 CLINICAL DATA:  Chest pain that began a few days ago EXAM: CHEST - 2 VIEW COMPARISON:  01/31/2010 FINDINGS: Hyperinflation. No consolidation, pneumothorax or effusion. No edema. Normal cardiopericardial silhouette. Degenerative changes along the spine. Overlapping cardiac leads. The posteroinferior costophrenic angles are clipped at the edge of the film. IMPRESSION: Hyperinflation.  No acute cardiopulmonary disease. Electronically Signed   By: Jill Side M.D.   On: 04/19/2022 12:28    Procedures Procedures    Medications Ordered in ED Medications - No data to display  ED Course/ Medical Decision Making/ A&P                             Medical Decision Making Amount and/or Complexity of Data Reviewed Labs: ordered. Radiology: ordered.   67 yo M with a chief complaints of left anterior shoulder pain.  This been going on for about 3 to 4 days.  The pain is sharp and radiates down the left arm.  Has been having some neck pain as well.  Denies trauma to the area.  He notes that he was a little bit more short of breath than normal when he was walking his dog this morning and so came here for evaluation for heart attack.  He otherwise denies exertional symptoms.  It makes the pain worse.  Most likely musculoskeletal by history and physical.  Will obtain a single troponin as the  patient's had symptoms going on greater than 48 hours.  No significant change in the last 6.  Troponin negative, no significant electrolyte abnormality, no anemia.  Will discharge home.  PCP follow-up.  1:41 PM:  I have discussed the diagnosis/risks/treatment options with the patient  and family.  Evaluation and diagnostic testing in the emergency department does not suggest an emergent condition requiring admission or immediate intervention beyond what has been performed at this time.  They will follow up with PCP. We also discussed returning to the ED immediately if new or worsening sx occur. We discussed the sx which are most concerning (e.g., sudden worsening pain, fever, inability to tolerate by mouth) that necessitate immediate return. Medications administered to the patient during their visit and any new prescriptions provided to the patient are listed below.  Medications given during this visit Medications - No data to display   The patient appears reasonably screen and/or stabilized for discharge and I doubt any other medical condition or other [facility] requiring further screening, evaluation, or treatment in the ED at this time prior to discharge.          Final Clinical Impression(s) / ED Diagnoses Final diagnoses:  Acute pain of left shoulder    Rx / DC Orders ED Discharge Orders          Ordered    diclofenac Sodium (VOLTAREN) 1 % GEL  4 times daily        04/19/22 1316              Melene Plan, DO 04/19/22 1341

## 2022-04-19 NOTE — ED Triage Notes (Signed)
Pt arrives to ED with c/o left sided chest pain with associated left hand numbness that started on 1/13. He notes today he experienced upper back/shoulder pain and slight DOE.

## 2022-04-19 NOTE — Discharge Instructions (Addendum)
Your marker for heart damage is normal.  Please follow-up with your family doctor in the office.  They may elect to do a more intensive workup as outpatient or perhaps refer you to a cardiologist.  Use the gel as prescribed. Also take tylenol 1000mg (2 extra strength) four times a day.

## 2022-08-05 ENCOUNTER — Other Ambulatory Visit: Payer: Self-pay | Admitting: Family Medicine

## 2022-08-05 ENCOUNTER — Other Ambulatory Visit (HOSPITAL_COMMUNITY): Payer: Self-pay | Admitting: Family Medicine

## 2022-08-05 DIAGNOSIS — R748 Abnormal levels of other serum enzymes: Secondary | ICD-10-CM

## 2022-09-10 ENCOUNTER — Ambulatory Visit (HOSPITAL_BASED_OUTPATIENT_CLINIC_OR_DEPARTMENT_OTHER)
Admission: RE | Admit: 2022-09-10 | Discharge: 2022-09-10 | Disposition: A | Discharge status: Home / Self Care | Payer: Medicare Other | Source: Ambulatory Visit | Attending: Family Medicine | Admitting: Family Medicine

## 2022-09-10 DIAGNOSIS — R748 Abnormal levels of other serum enzymes: Secondary | ICD-10-CM | POA: Insufficient documentation

## 2022-09-27 ENCOUNTER — Ambulatory Visit: Payer: Medicare Other | Admitting: Cardiology

## 2022-09-27 ENCOUNTER — Encounter: Payer: Self-pay | Admitting: Cardiology

## 2022-09-27 VITALS — BP 136/82 | HR 76 | Resp 16 | Ht 76.0 in | Wt 271.0 lb

## 2022-09-27 DIAGNOSIS — I77810 Thoracic aortic ectasia: Secondary | ICD-10-CM

## 2022-09-27 DIAGNOSIS — R931 Abnormal findings on diagnostic imaging of heart and coronary circulation: Secondary | ICD-10-CM

## 2022-09-27 DIAGNOSIS — I10 Essential (primary) hypertension: Secondary | ICD-10-CM

## 2022-09-27 DIAGNOSIS — I700 Atherosclerosis of aorta: Secondary | ICD-10-CM

## 2022-09-27 MED ORDER — AMLODIPINE BESYLATE 5 MG PO TABS: 2.5000 mg | ORAL_TABLET | Freq: Every day | ORAL | 1 refills | Status: AC

## 2022-09-27 NOTE — Progress Notes (Signed)
Primary Physician/Referring:  Tally Joe, MD  Patient ID: Walter Strickland, male    DOB: Oct 14, 1955, 67 y.o.   MRN: [id number]  Chief Complaint  Patient presents with   Aortic atherosclerosis    Ascending aorta dilatation   New Patient (Initial Visit)   HPI:    Walter Strickland  is a 67 y.o. Caucasian male patient with primary hypertension, referred to me for evaluation of abnormal coronary calcium score and aortic root dilatation. He is asymptomatic.  Past Medical History:  Diagnosis Date   Hypertension    Past Surgical History:  Procedure Laterality Date   APPENDECTOMY  1989   KNEE DEBRIDEMENT  1989   Family History  Problem Relation Age of Onset   Cancer Mother    Heart failure Father    Heart block Brother     Social History   Tobacco Use   Smoking status: Never   Smokeless tobacco: Never  Substance Use Topics   Alcohol use: Yes    Comment: occ   Marital Status: Married  ROS  Review of Systems  Cardiovascular:  Negative for chest pain, dyspnea on exertion and leg swelling.   Objective      09/27/2022    9:19 AM 09/27/2022    9:14 AM 04/19/2022    1:15 PM  Vitals with BMI  Height  6\' 4"    Weight  271 lbs   BMI  33   Systolic 136 168 409  Diastolic 82 93 68  Pulse  76 66   Blood pressure 136/82, pulse 76, resp. rate 16, height 6\' 4"  (1.93 m), weight 271 lb (122.9 kg), SpO2 97 %.  Physical Exam Neck:     Vascular: No carotid bruit or JVD.  Cardiovascular:     Rate and Rhythm: Normal rate and regular rhythm.     Pulses: Intact distal pulses.     Heart sounds: Normal heart sounds. No murmur heard.    No gallop.  Pulmonary:     Effort: Pulmonary effort is normal.     Breath sounds: Normal breath sounds.  Abdominal:     General: Bowel sounds are normal.     Palpations: Abdomen is soft.  Musculoskeletal:     Right lower leg: No edema.     Left lower leg: No edema.    Laboratory examination:   Recent Labs    04/19/22 1210  NA 137   K 4.3  CL 107  CO2 21*  GLUCOSE 104*  BUN 25*  CREATININE 1.30*  CALCIUM 9.5  GFRNONAA 60*    Lab Results  Component Value Date   GLUCOSE 104 (H) 04/19/2022   NA 137 04/19/2022   K 4.3 04/19/2022   CL 107 04/19/2022   CO2 21 (L) 04/19/2022   BUN 25 (H) 04/19/2022   CREATININE 1.30 (H) 04/19/2022   GFRNONAA 60 (L) 04/19/2022   CALCIUM 9.5 04/19/2022   PROT 7.5 09/27/2019   ALBUMIN 4.2 09/27/2019   BILITOT 0.6 09/27/2019   ALKPHOS 69 09/27/2019   AST 17 09/27/2019   ALT 21 09/27/2019   ANIONGAP 9 04/19/2022      Lab Results  Component Value Date   ALT 21 09/27/2019   AST 17 09/27/2019   ALKPHOS 69 09/27/2019   BILITOT 0.6 09/27/2019       Latest Ref Rng & Units 04/19/2022   12:10 PM 04/29/2021    6:53 PM 09/27/2019   12:45 PM  CBC  WBC 4.0 - 10.5 K/uL 6.3  8.4  10.2   Hemoglobin 13.0 - 17.0 g/dL 21.3  08.6  57.8   Hematocrit 39.0 - 52.0 % 43.5  48.7  45.7   Platelets 150 - 400 K/uL 248  242  251     External labs:   Cholesterol, total 151.000 m 08/05/2022 HDL 45.000 mg 08/05/2022 LDL 94.000 mg 08/05/2022 Triglycerides 61.000 mg 08/05/2022  A1C 5.600 mg/ 08/05/2022  TSH 1.550 01/28/2022  Radiology:   CT abd 09/07/19: No AAA  CXR 2 views 04/19/2022: Hyperinflation. No consolidation, pneumothorax or effusion. No edema. Normal cardiopericardial silhouette. Degenerative changes along the spine. Overlapping cardiac leads. The posteroinferior costophrenic angles are clipped at the edge of the film.  Cardiac Studies:   Coronary calcium score 09/10/2022 Total Agatston coronary calcium score 148. MESA database percentile 54. LM: 0 LAD: 30 LCx: 5 RCA: 113 Evidence of mild ascending aortic dilation, 40 mm, on non-contrasted study. Aortic atherosclerosis. Extracardiac abnormalities: None.   EKG:   EKG 09/27/2022: Normal sinus rhythm at rate of 77 bpm, leftward enlargement, otherwise normal EKG.   Medications and allergies   Allergies  Allergen Reactions    Penicillin V Rash   Penicillins Rash     Medication list   Current Outpatient Medications:    B Complex Vitamins (VITAMIN B COMPLEX) TABS, Take 1,000 each by mouth daily., Disp: , Rfl:    Cholecalciferol 25 MCG (1000 UT) capsule, Take 1,000 Units by mouth daily., Disp: , Rfl:    clonazePAM (KLONOPIN) 1 MG tablet, Take 1 mg by mouth 2 (two) times daily as needed for anxiety., Disp: , Rfl:    olmesartan (BENICAR) 40 MG tablet, Take 40 mg by mouth daily., Disp: , Rfl:    Omega-3 Fatty Acids (FISH OIL) 1000 MG CAPS, Fish Oil 1,000 mg (120 mg-180 mg) capsule  Take by oral route., Disp: , Rfl:    traZODone (DESYREL) 50 MG tablet, 1 tablet at bedtime as needed, Disp: , Rfl:    amLODipine (NORVASC) 5 MG tablet, Take 0.5 tablets (2.5 mg total) by mouth daily., Disp: 90 tablet, Rfl: 1  Assessment     ICD-10-CM   1. Aortic root dilatation (HCC)  I77.810 EKG 12-Lead    PCV ECHOCARDIOGRAM COMPLETE    2. Aortic atherosclerosis (HCC)  I70.0 PCV ECHOCARDIOGRAM COMPLETE    3. Elevated coronary artery calcium score 09/10/2022: Total Agatston coronary calcium score 148. MESA database percentile 54.  R93.1     4. Primary hypertension  I10 amLODipine (NORVASC) 5 MG tablet    PCV ECHOCARDIOGRAM COMPLETE       Orders Placed This Encounter  Procedures   EKG 12-Lead   PCV ECHOCARDIOGRAM COMPLETE    Standing Status:   Future    Standing Expiration Date:   09/27/2023    Meds ordered this encounter  Medications   amLODipine (NORVASC) 5 MG tablet    Sig: Take 0.5 tablets (2.5 mg total) by mouth daily.    Dispense:  90 tablet    Refill:  1    Medications Discontinued During This Encounter  Medication Reason   COVID-19 Test (ID NOW COVID-19) KIT    diclofenac Sodium (VOLTAREN) 1 % GEL    losartan (COZAAR) 100 MG tablet    Misc Natural Products (GLUCOSAMINE CHOND DOUBLE STR) TABS    azelastine (ASTELIN) 0.1 % nasal spray Patient has not taken in last 30 days   Saw Palmetto 500 MG CAPS Patient has  not taken in last 30 days   Turmeric 400 MG  CAPS Patient has not taken in last 30 days   amLODipine (NORVASC) 2.5 MG tablet Reorder     Recommendations:   Walter Strickland is a 67 y.o. Caucasian male patient with primary hypertension, referred to me for evaluation of abnormal coronary calcium score and aortic root dilatation.  1. Aortic root dilatation (HCC) Patient's aortic root dilatation is marginal.  He is also 6 feet 4 inches tall, also has hypertension, probably aortic root dilatation is within normal range for him.  I also reviewed his CT scan of the abdomen done in 2019 which reveals no evidence of AAA.  For now I have recommended an echocardiogram to see whether aortic root size can be quantified further.  I will see him back in a year for follow-up of the same.  He is presently on ARB, which would be beneficial for prevention of aortic aneurysm anyways.  Sodium  - EKG 12-Lead - PCV ECHOCARDIOGRAM COMPLETE; Future  2. Aortic atherosclerosis (HCC) Patient has aortic atherosclerosis, his brother and his father have had coronary events in their 69 years of age, I recommended that patient be on a low-dose low intensity on a moderate intensity statin, pravastatin 20 mg or atorvastatin 10 mg will be appropriate for the patient however he would prefer not to be on statin therapy for now. - PCV ECHOCARDIOGRAM COMPLETE; Future  3. Elevated coronary artery calcium score 09/10/2022: Total Agatston coronary calcium score 148. MESA database percentile 54. His coronary calcium score is right in the 50th percentile for his age.  Again his best option would be to be either on a low-dose statin what appears to be 5 mg daily, this would be appropriate for the patient if he so chooses to start.  4. Primary hypertension Patient probably has whitecoat hypertension as patient states that his blood pressure is very well-controlled at home.  His EKG reveals left atrial enlargement suggestive of probably  hypertension, also he is presently on 2.5 mg of amlodipine which was increased to 5 mg daily.  Goal blood pressure 130/80 or less.  In view of aortic root dilatation preferred to have diastolic blood pressure on 75 to 80 mmHg and systolic blood pressure between 125 and 130 mmHg.  I will see him back in a year or sooner if problems. - amLODipine (NORVASC) 5 MG tablet; Take 0.5 tablets (2.5 mg total) by mouth daily.  Dispense: 90 tablet; Refill: 1 - PCV ECHOCARDIOGRAM COMPLETE; Future  Other orders - B Complex Vitamins (VITAMIN B COMPLEX) TABS; Take 1,000 each by mouth daily. - olmesartan (BENICAR) 40 MG tablet; Take 40 mg by mouth daily.     Yates Decamp, MD, [facility] 09/27/2022, 9:49 AM Office: [phone]

## 2022-10-20 ENCOUNTER — Ambulatory Visit: Payer: Medicare Other

## 2022-10-20 DIAGNOSIS — I77810 Thoracic aortic ectasia: Secondary | ICD-10-CM

## 2022-10-20 DIAGNOSIS — I700 Atherosclerosis of aorta: Secondary | ICD-10-CM

## 2022-10-20 DIAGNOSIS — I10 Essential (primary) hypertension: Secondary | ICD-10-CM

## 2022-11-04 ENCOUNTER — Telehealth: Payer: Self-pay

## 2022-11-04 NOTE — Progress Notes (Signed)
Echocardiogram 10/20/2022:  Left ventricle cavity is normal in size. Mild concentric hypertrophy of the left ventricle. Normal global wall motion. Doppler evidence of grade I (impaired) diastolic dysfunction, normal LAP. Normal LV systolic function with visual EF 55-60%. Calculated EF 53%. Left atrial cavity is slightly dilated. The aortic root is normal.  Mildly dilated ascending aorta at 4.0 cm.

## 2022-11-04 NOTE — Telephone Encounter (Signed)
Patient called asking for his echo result. They have not yet been results. Can you please read them.

## 2022-11-04 NOTE — Telephone Encounter (Signed)
Done and normal and mild dilatation of aorta nothing to be concerned and will monitor this closely. [employer] also

## 2023-09-27 ENCOUNTER — Ambulatory Visit: Payer: Medicare Other | Admitting: Cardiology

## 2024-05-24 ENCOUNTER — Ambulatory Visit: Admitting: Cardiology
